# Patient Record
Sex: Male | Born: 1980 | Race: White | Hispanic: No | Marital: Single | Smoking: Never smoker
Health system: Southern US, Community
[De-identification: ages and names within clinical notes are randomized; demographics above are authoritative.]

## PROBLEM LIST (undated history)

## (undated) DIAGNOSIS — F32A Depression, unspecified: Secondary | ICD-10-CM

## (undated) DIAGNOSIS — F329 Major depressive disorder, single episode, unspecified: Secondary | ICD-10-CM

## (undated) DIAGNOSIS — M109 Gout, unspecified: Secondary | ICD-10-CM

## (undated) DIAGNOSIS — N289 Disorder of kidney and ureter, unspecified: Secondary | ICD-10-CM

## (undated) DIAGNOSIS — F419 Anxiety disorder, unspecified: Secondary | ICD-10-CM

## (undated) HISTORY — PX: WRIST FRACTURE SURGERY: SHX121

## (undated) HISTORY — PX: APPENDECTOMY: SHX54

---
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
## 2019-09-11 ENCOUNTER — Emergency Department (HOSPITAL_COMMUNITY)
Admission: EM | Admit: 2019-09-11 | Discharge: 2019-09-11 | Disposition: A | Discharge status: Home / Self Care | Payer: 59 | Attending: Emergency Medicine | Admitting: Emergency Medicine

## 2019-09-11 ENCOUNTER — Other Ambulatory Visit: Payer: Self-pay

## 2019-09-11 ENCOUNTER — Encounter (HOSPITAL_COMMUNITY): Payer: Self-pay | Admitting: Emergency Medicine

## 2019-09-11 ENCOUNTER — Emergency Department (HOSPITAL_COMMUNITY): Payer: 59

## 2019-09-11 DIAGNOSIS — Y939 Activity, unspecified: Secondary | ICD-10-CM | POA: Insufficient documentation

## 2019-09-11 DIAGNOSIS — Z23 Encounter for immunization: Secondary | ICD-10-CM | POA: Insufficient documentation

## 2019-09-11 DIAGNOSIS — W25XXXA Contact with sharp glass, initial encounter: Secondary | ICD-10-CM | POA: Insufficient documentation

## 2019-09-11 DIAGNOSIS — S61411A Laceration without foreign body of right hand, initial encounter: Secondary | ICD-10-CM | POA: Insufficient documentation

## 2019-09-11 DIAGNOSIS — Y999 Unspecified external cause status: Secondary | ICD-10-CM | POA: Diagnosis not present

## 2019-09-11 DIAGNOSIS — Y929 Unspecified place or not applicable: Secondary | ICD-10-CM | POA: Insufficient documentation

## 2019-09-11 DIAGNOSIS — Z91040 Latex allergy status: Secondary | ICD-10-CM | POA: Insufficient documentation

## 2019-09-11 HISTORY — DX: Depression, unspecified: F32.A

## 2019-09-11 HISTORY — DX: Disorder of kidney and ureter, unspecified: N28.9

## 2019-09-11 HISTORY — DX: Anxiety disorder, unspecified: F41.9

## 2019-09-11 HISTORY — DX: Gout, unspecified: M10.9

## 2019-09-11 MED ORDER — LIDOCAINE HCL (PF) 1 % IJ SOLN: 2.0000 mL | Freq: Once | INTRAMUSCULAR | Status: DC

## 2019-09-11 MED ORDER — TETANUS-DIPHTH-ACELL PERTUSSIS 5-2.5-18.5 LF-MCG/0.5 IM SUSP
0.5000 mL | Freq: Once | INTRAMUSCULAR | Status: AC
  Administered 2019-09-11: 0.5 mL via INTRAMUSCULAR
  Filled 2019-09-11: qty 0.5

## 2019-09-11 MED ORDER — LIDOCAINE HCL (PF) 1 % IJ SOLN
INTRAMUSCULAR | Status: AC
  Filled 2019-09-11: qty 2

## 2019-09-11 MED ORDER — BACITRACIN-NEOMYCIN-POLYMYXIN 400-5-5000 EX OINT
TOPICAL_OINTMENT | Freq: Once | CUTANEOUS | Status: AC
  Administered 2019-09-11: 2 via TOPICAL
  Filled 2019-09-11: qty 2

## 2019-09-11 NOTE — ED Triage Notes (Signed)
Patient c/o laceration to posterior of right hand. Per patient cut hand with broken mirror by accident. Patient unsure of last tetanus vaccination. x3 laceratons noted to hand. (x2 small). Small amount of active bleeding noted. Hand dressed with telfa and ace in triage.

## 2019-09-11 NOTE — ED Notes (Signed)
PA in to repair 

## 2019-09-11 NOTE — ED Provider Notes (Signed)
[facility] EMERGENCY DEPARTMENT Provider Note   CSN: [id number] Arrival date & time: 09/11/19  1128     History   Chief Complaint Chief Complaint  Patient presents with  . Laceration    HPI Zachary Foster is a 38 y.o. male.     38 year old male past medical history of gout, renal disorder, depression presenting to the emergency department for laceration to the right hand sustained just prior to arrival when a mirror broke up with his hand.  Unknown last tetanus shot.  No other injuries.     Past Medical History:  Diagnosis Date  . Anxiety   . Depression   . Gout   . Renal disorder     There are no active problems to display for this patient.   Past Surgical History:  Procedure Laterality Date  . APPENDECTOMY    . WRIST FRACTURE SURGERY          Home Medications    Prior to Admission medications   Not on File    Family History History reviewed. No pertinent family history.  Social History Social History   Tobacco Use  . Smoking status: Never Smoker  . Smokeless tobacco: Never Used  Substance Use Topics  . Alcohol use: Yes    Comment: rarely  . Drug use: Never     Allergies   Latex   Review of Systems Review of Systems  Constitutional: Negative for fever.  Musculoskeletal: Negative for arthralgias.  Skin: Positive for wound. Negative for color change and rash.  Allergic/Immunologic: Negative for immunocompromised state.  Hematological: Does not bruise/bleed easily.  All other systems reviewed and are negative.    Physical Exam Updated Vital Signs BP (!) 144/100 (BP Location: Right Arm)   Pulse 85   Temp 98.5 F (36.9 C) (Oral)   Resp 17   Ht 5\' 11"  (1.803 m)   Wt 131.5 kg   SpO2 96%   BMI 40.45 kg/m   Physical Exam Vitals signs and nursing note reviewed.  Constitutional:      Appearance: Normal appearance.  HENT:     Head: Normocephalic.  Eyes:     Conjunctiva/sclera: Conjunctivae normal.  Pulmonary:     Effort:  Pulmonary effort is normal.  Skin:    General: Skin is dry.     Comments: Patient is neurovascularly intact in the right hand.  There is a 1 cm linear laceration over the dorsum of the lateral right hand.  Bleeding is controlled.  There are some adjacent abrasions without bleeding  Neurological:     General: No focal deficit present.     Mental Status: He is alert.  Psychiatric:        Mood and Affect: Mood normal.      ED Treatments / Results  Labs (all labs ordered are listed, but only abnormal results are displayed) Labs Reviewed - No data to display  EKG None  Radiology Dg Hand Complete Right  Result Date: 09/11/2019 CLINICAL DATA:  Acute RIGHT hand pain following injury with mirror. Initial encounter. EXAM: RIGHT HAND - COMPLETE 3+ VIEW COMPARISON:  None. FINDINGS: No acute fracture, subluxation or dislocation. No joint abnormalities are present. Minimal dorsal soft tissue irregularity likely represents soft tissue injury. No radiopaque foreign body noted. IMPRESSION: Minimal dorsal soft tissue irregularity likely representing soft tissue injury. No evidence of radiopaque foreign body or acute bony abnormality. Electronically Signed   By: Margarette Canada M.D.   On: 09/11/2019 12:51    Procedures .Marland KitchenLaceration  Repair  Date/Time: 09/11/2019 1:11 PM Performed by: Arlyn Dunning, PA-C Authorized by: Arlyn Dunning, PA-C   Consent:    Consent obtained:  Verbal   Consent given by:  Patient   Risks discussed:  Infection, pain and need for additional repair   Alternatives discussed:  No treatment and delayed treatment Anesthesia (see MAR for exact dosages):    Anesthesia method:  Local infiltration   Local anesthetic:  Lidocaine 1% w/o epi Laceration details:    Location:  Hand   Hand location:  R hand, dorsum   Length (cm):  1   Depth (mm):  0.3 Repair type:    Repair type:  Simple Pre-procedure details:    Preparation:  Patient was prepped and draped in usual sterile  fashion Exploration:    Hemostasis achieved with:  Direct pressure   Wound exploration: wound explored through full range of motion     Wound extent: no fascia violation noted, no foreign bodies/material noted, no muscle damage noted, no nerve damage noted, no tendon damage noted, no underlying fracture noted and no vascular damage noted     Contaminated: no   Treatment:    Area cleansed with:  Betadine and saline   Amount of cleaning:  Standard   Irrigation method:  Syringe Skin repair:    Repair method:  Sutures   Suture size:  4-0   Suture material:  Nylon   Suture technique:  Simple interrupted   Number of sutures:  3 Approximation:    Approximation:  Close Post-procedure details:    Dressing:  Antibiotic ointment and non-adherent dressing   Patient tolerance of procedure:  Tolerated well, no immediate complications   (including critical care time)  Medications Ordered in ED Medications  lidocaine (PF) (XYLOCAINE) 1 % injection 2 mL (2 mLs Intradermal Handoff 09/11/19 1256)  neomycin-bacitracin-polymyxin (NEOSPORIN) ointment packet (has no administration in time range)  Tdap (BOOSTRIX) injection 0.5 mL (0.5 mLs Intramuscular Given 09/11/19 1301)     Initial Impression / Assessment and Plan / ED Course  I have reviewed the triage vital signs and the nursing notes.  Pertinent labs & imaging results that were available during my care of the patient were reviewed by me and considered in my medical decision making (see chart for details).        Based on review of vitals, medical screening exam, lab work and/or imaging, there does not appear to be an acute, emergent etiology for the patient's symptoms. Counseled pt on good return precautions and encouraged both PCP and ED follow-up as needed.  Prior to discharge, I also discussed incidental imaging findings with patient in detail and advised appropriate, recommended follow-up in detail.  Clinical Impression: 1. Laceration  of right hand without foreign body, initial encounter     Disposition: Discharge  Prior to providing a prescription for a controlled substance, I independently reviewed the patient's recent prescription history on the [place] Controlled Substance Reporting System. The patient had no recent or regular prescriptions and was deemed appropriate for a brief, less than 3 day prescription of narcotic for acute analgesia.  This note was prepared with assistance of Conservation officer, historic buildings. Occasional wrong-word or sound-a-like substitutions may have occurred due to the inherent limitations of voice recognition software.   Final Clinical Impressions(s) / ED Diagnoses   Final diagnoses:  Laceration of right hand without foreign body, initial encounter    ED Discharge Orders    None  Arlyn DunningMcLean, Annell Canty A, PA-C 09/11/19 1312    Samuel JesterMcManus, Kathleen, DO 09/13/19 1123

## 2019-09-11 NOTE — Discharge Instructions (Addendum)
Thank you for allowing me to care for you today. Please return to the emergency department if you have new or worsening symptoms. Take your medications as instructed.  ° °

## 2020-10-20 IMAGING — DX DG HAND COMPLETE 3+V*R*
3 series · 3 of 3 positions shown · non-contrast
Comparison: None.

CLINICAL DATA: Acute RIGHT hand pain following injury with mirror.
Initial encounter.

EXAM:
RIGHT HAND - COMPLETE 3+ VIEW

[hand pa]
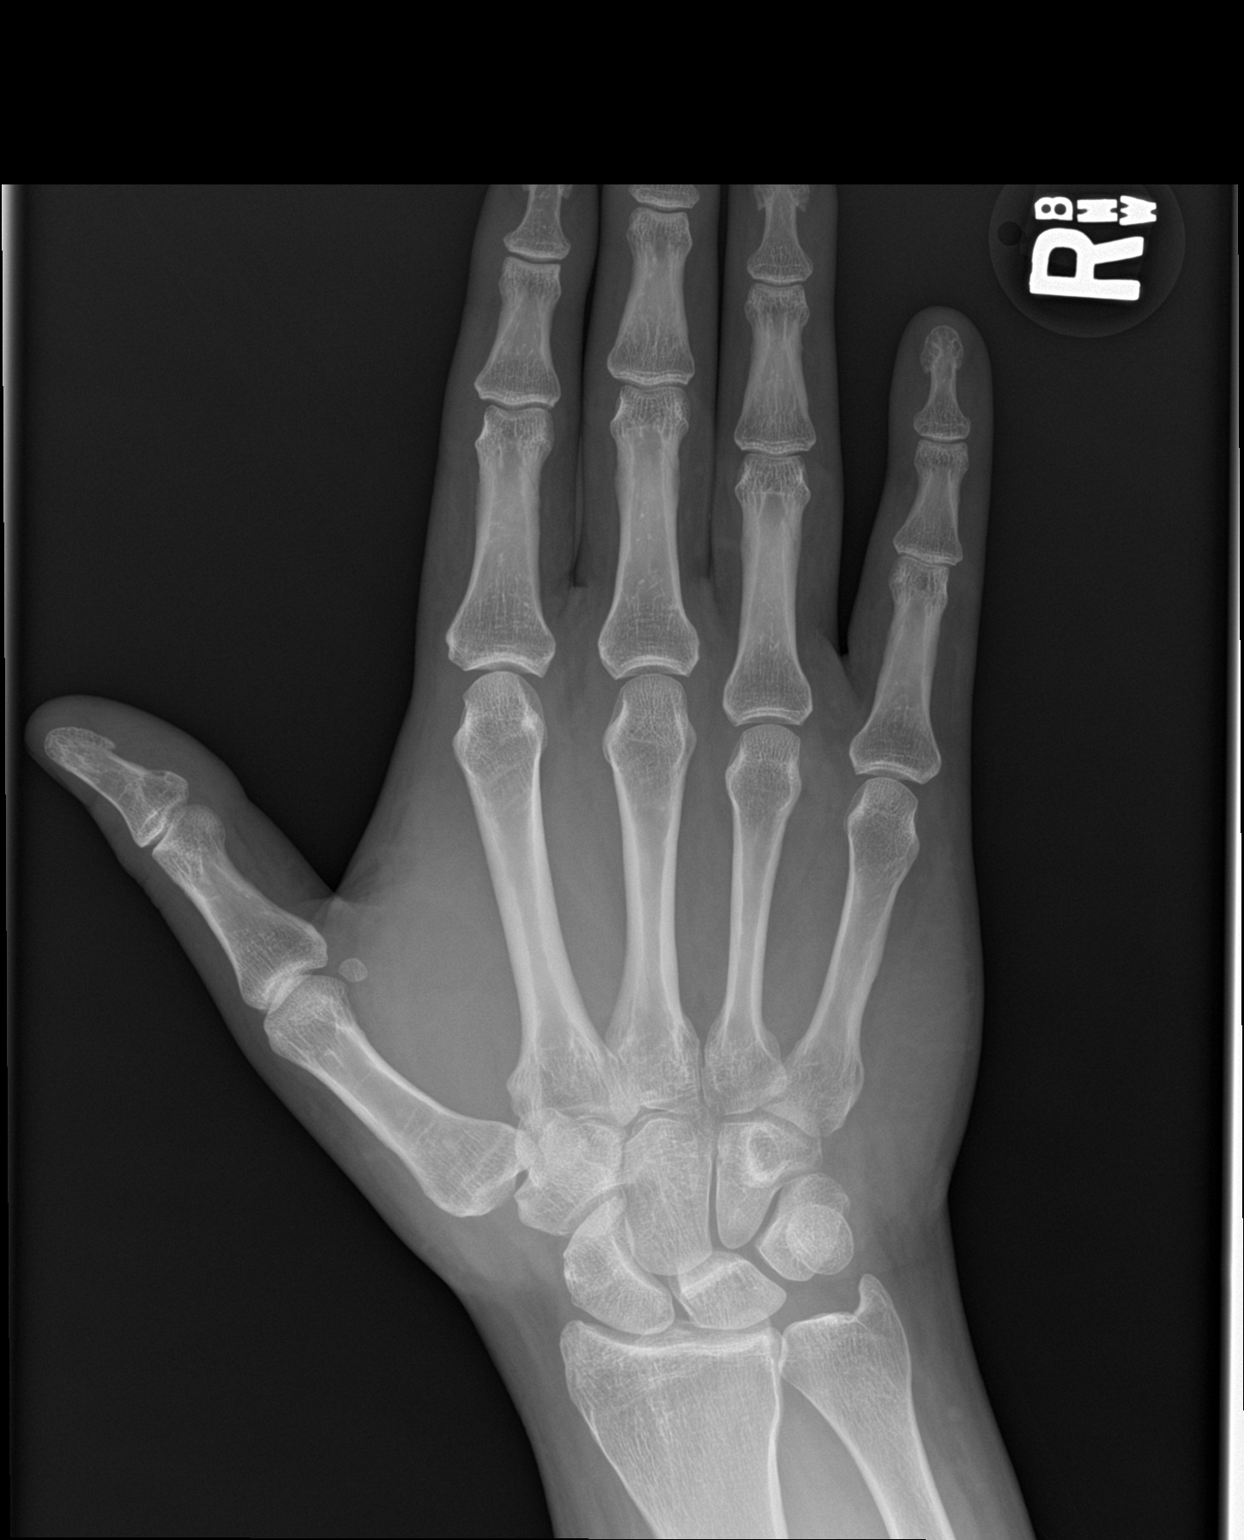

[hand obl]
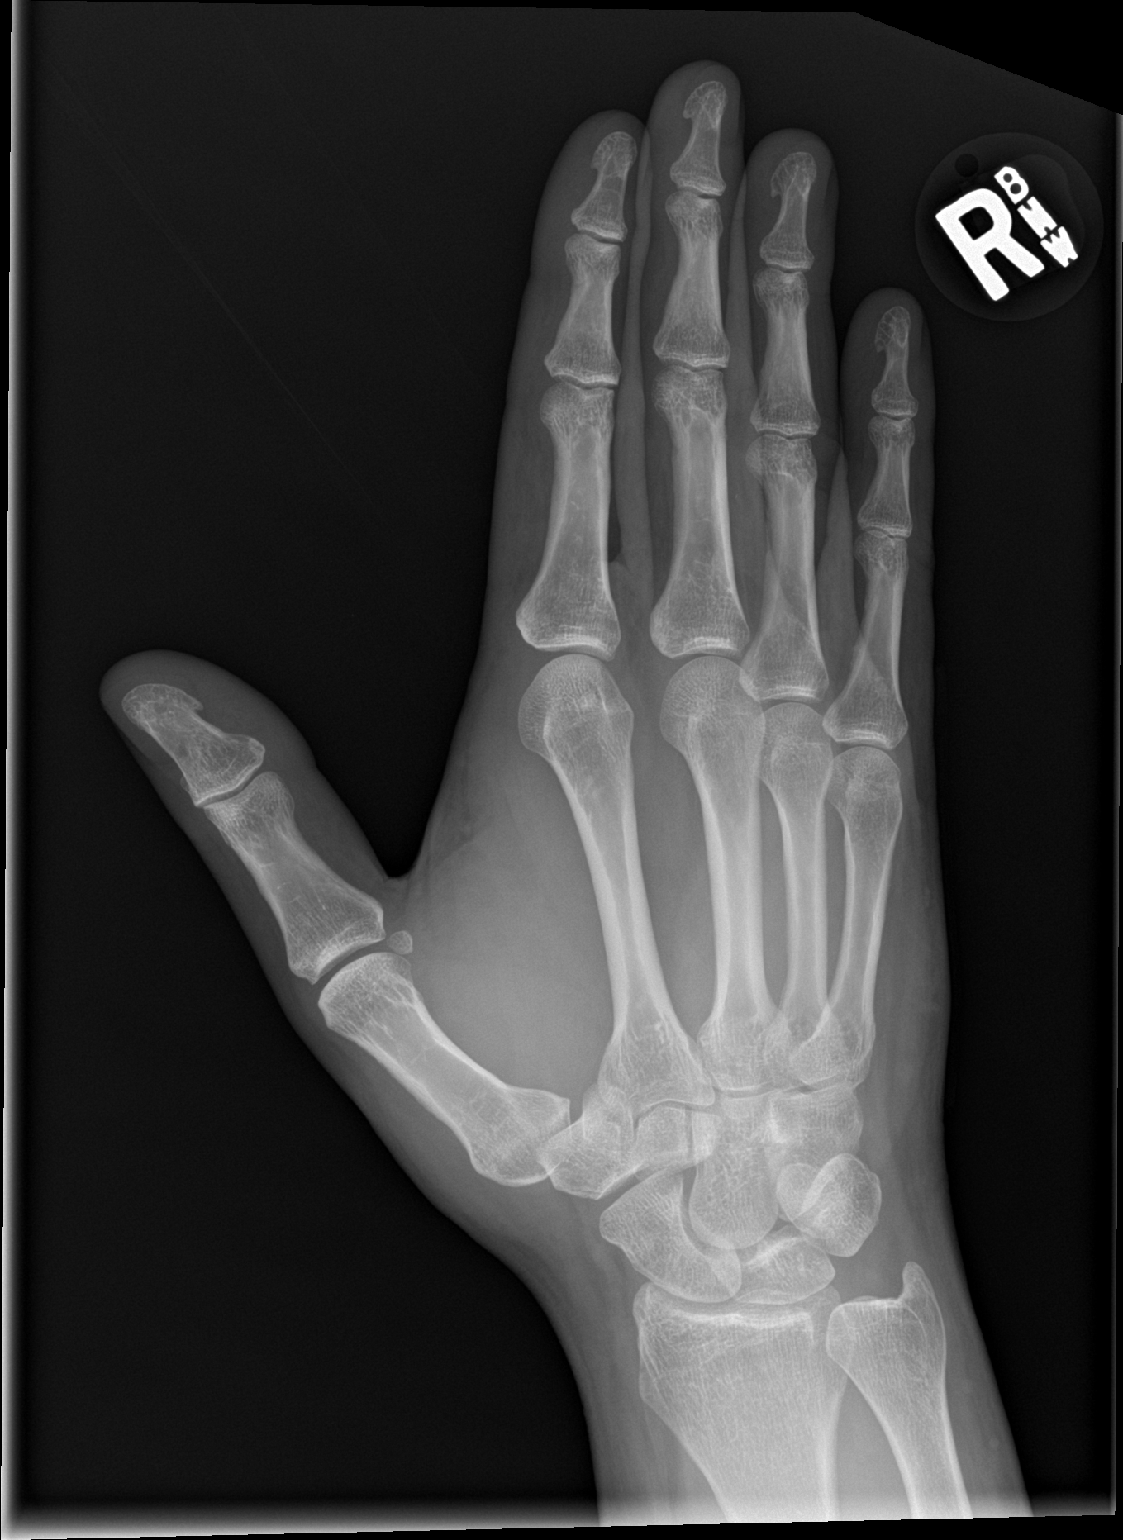

[hand lat]
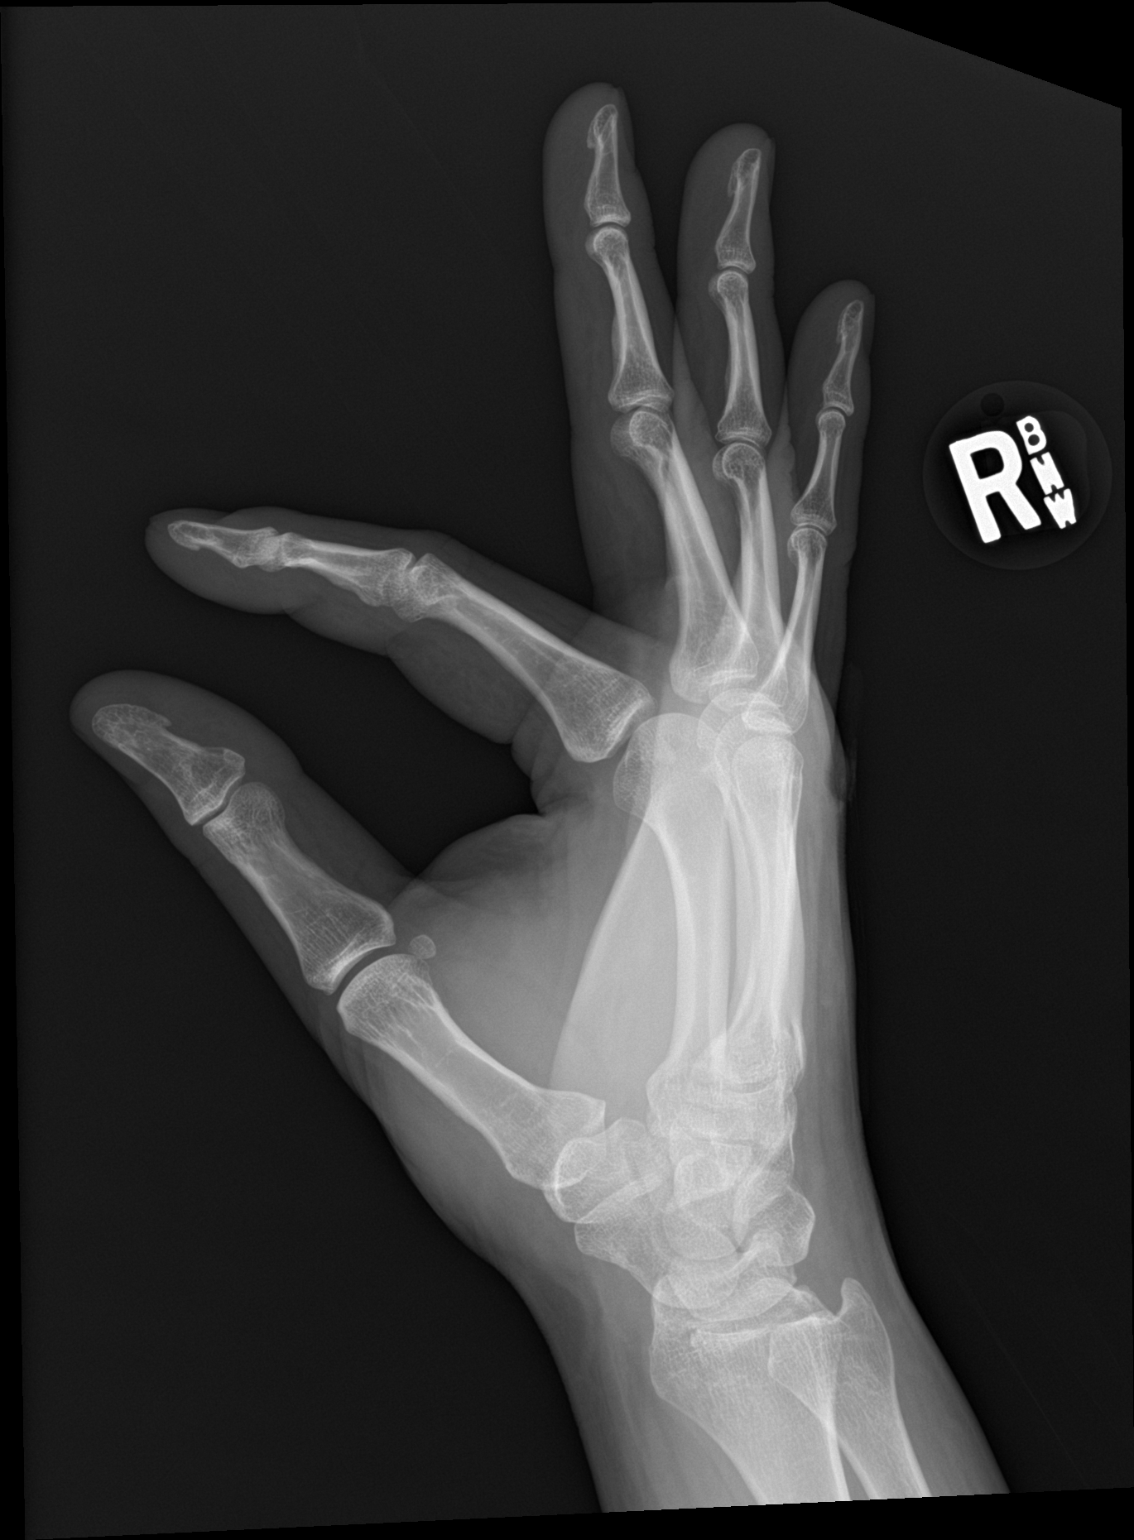

[3 of 3 positions shown; findings below may reference images not displayed]

FINDINGS: No acute fracture, subluxation or dislocation.

No joint abnormalities are present.

Minimal dorsal soft tissue irregularity likely represents soft
tissue injury.

No radiopaque foreign body noted.
IMPRESSION: Minimal dorsal soft tissue irregularity likely representing soft
tissue injury. No evidence of radiopaque foreign body or acute bony
abnormality.
